# Patient Record
Sex: Male | Born: 1950 | Race: White | Hispanic: No | Marital: Married | Smoking: Former smoker
Health system: Southern US, Community
[De-identification: ages and names within clinical notes are randomized; demographics above are authoritative.]

## PROBLEM LIST (undated history)

## (undated) DIAGNOSIS — I10 Essential (primary) hypertension: Secondary | ICD-10-CM

## (undated) DIAGNOSIS — I639 Cerebral infarction, unspecified: Secondary | ICD-10-CM

## (undated) DIAGNOSIS — B20 Human immunodeficiency virus [HIV] disease: Secondary | ICD-10-CM

---

## 2016-12-29 ENCOUNTER — Other Ambulatory Visit: Payer: Self-pay

## 2016-12-29 ENCOUNTER — Encounter (HOSPITAL_BASED_OUTPATIENT_CLINIC_OR_DEPARTMENT_OTHER): Payer: Self-pay | Admitting: *Deleted

## 2016-12-29 ENCOUNTER — Emergency Department (HOSPITAL_BASED_OUTPATIENT_CLINIC_OR_DEPARTMENT_OTHER)
Admission: EM | Admit: 2016-12-29 | Discharge: 2016-12-29 | Disposition: A | Discharge status: Home / Self Care | Payer: Medicare Other | Attending: Emergency Medicine | Admitting: Emergency Medicine

## 2016-12-29 ENCOUNTER — Emergency Department (HOSPITAL_BASED_OUTPATIENT_CLINIC_OR_DEPARTMENT_OTHER): Payer: Medicare Other

## 2016-12-29 DIAGNOSIS — M5412 Radiculopathy, cervical region: Secondary | ICD-10-CM | POA: Diagnosis not present

## 2016-12-29 DIAGNOSIS — Z87891 Personal history of nicotine dependence: Secondary | ICD-10-CM | POA: Insufficient documentation

## 2016-12-29 DIAGNOSIS — Z7982 Long term (current) use of aspirin: Secondary | ICD-10-CM | POA: Diagnosis not present

## 2016-12-29 DIAGNOSIS — Z21 Asymptomatic human immunodeficiency virus [HIV] infection status: Secondary | ICD-10-CM | POA: Diagnosis not present

## 2016-12-29 DIAGNOSIS — I10 Essential (primary) hypertension: Secondary | ICD-10-CM | POA: Insufficient documentation

## 2016-12-29 DIAGNOSIS — M542 Cervicalgia: Secondary | ICD-10-CM | POA: Diagnosis present

## 2016-12-29 DIAGNOSIS — Z79899 Other long term (current) drug therapy: Secondary | ICD-10-CM | POA: Insufficient documentation

## 2016-12-29 HISTORY — DX: Essential (primary) hypertension: I10

## 2016-12-29 HISTORY — DX: Cerebral infarction, unspecified: I63.9

## 2016-12-29 HISTORY — DX: Human immunodeficiency virus (HIV) disease: B20

## 2016-12-29 MED ORDER — KETOROLAC TROMETHAMINE 10 MG PO TABS: 10.0000 mg | ORAL_TABLET | Freq: Four times a day (QID) | ORAL | 0 refills | Status: DC | PRN

## 2016-12-29 MED ORDER — DEXAMETHASONE SODIUM PHOSPHATE 10 MG/ML IJ SOLN
10.0000 mg | Freq: Once | INTRAMUSCULAR | Status: AC
  Administered 2016-12-29: 10 mg via INTRAMUSCULAR
  Filled 2016-12-29: qty 1

## 2016-12-29 MED ORDER — IBUPROFEN 600 MG PO TABS: 600.0000 mg | ORAL_TABLET | Freq: Four times a day (QID) | ORAL | 0 refills | Status: AC | PRN

## 2016-12-29 MED ORDER — PREDNISONE 10 MG (21) PO TBPK: ORAL_TABLET | ORAL | 0 refills | Status: AC

## 2016-12-29 MED ORDER — KETOROLAC TROMETHAMINE 30 MG/ML IJ SOLN
30.0000 mg | Freq: Once | INTRAMUSCULAR | Status: AC
  Administered 2016-12-29: 30 mg via INTRAMUSCULAR
  Filled 2016-12-29: qty 1

## 2016-12-29 MED FILL — predniSONE 10 MG TABS: 10 | 12 days supply | Qty: 42 | Fill #0

## 2016-12-29 MED FILL — IBUPROFEN 600 MG TABLET: 600 | 5 days supply | Qty: 20 | Fill #0

## 2016-12-29 NOTE — ED Notes (Signed)
Patient transported to X-ray 

## 2016-12-29 NOTE — ED Provider Notes (Signed)
[facility]   CSN: [id number] Arrival date & time: 12/29/16  1237     History   Chief Complaint Chief Complaint  Patient presents with  . Arm Pain    HPI Nathan Boyd is a 66 y.o. male.  Pt presents to the ED today with left arm pain.  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  The pt said that he usually sleeps on his right side, but thinks he flipped over on his left side.  He woke up with pain to his left shoulder and neck with pain down to left elbow.  Pt denies cp or sob.      Past Medical History:  Diagnosis Date  . HIV (human immunodeficiency virus infection) (HCC)   . Hypertension   . Stroke [facility])     There are no active problems to display for this patient.   History reviewed. No pertinent surgical history.     Home Medications    Prior to Admission medications   Medication Sig Start Date End Date Taking? Authorizing Provider  aspirin EC 81 MG tablet Take 81 mg by mouth daily.   Yes [provider]  atorvastatin (LIPITOR) 40 MG tablet Take 40 mg by mouth daily.   Yes [provider]  cetirizine (ZYRTEC) 5 MG tablet Take 5 mg by mouth daily.   Yes [provider]  clopidogrel (PLAVIX) 75 MG tablet Take 75 mg by mouth daily.   Yes [provider]  docusate sodium (COLACE) 100 MG capsule Take 100 mg by mouth 2 (two) times daily.   Yes [provider]  efavirenz-emtricitabine-tenofovir (ATRIPLA) 600-200-300 MG tablet Take 1 tablet by mouth at bedtime.   Yes [provider]  ezetimibe (ZETIA) 10 MG tablet Take 10 mg by mouth daily.   Yes [provider]  ferrous sulfate 325 (65 FE) MG EC tablet Take 325 mg by mouth 3 (three) times daily with meals.   Yes [provider]  folic acid (FOLVITE) 1 MG tablet Take 1 mg by mouth daily.   Yes [provider]  lisinopril (PRINIVIL,ZESTRIL) 10 MG tablet Take 10 mg by mouth daily.   Yes [provider]  ketorolac (TORADOL) 10 MG tablet Take 1 tablet (10 mg total) by mouth every 6 (six) hours as needed. 12/29/16   Jacalyn Lefevre, MD  predniSONE (STERAPRED UNI-PAK 21 TAB) 10 MG (21) TBPK tablet Take 6 tabs by mouth daily  for 2 days, then 5 tabs for 2 days, then 4 tabs for 2 days, then 3 tabs for 2 days, 2 tabs for 2 days, then 1 tab by mouth daily for 2 days 12/29/16   Jacalyn Lefevre, MD    Family History History reviewed. No pertinent family history.  Social History Social History  Substance Use Topics  . Smoking status: Former Games developer  . Smokeless tobacco: Never Used  . Alcohol use Yes     Allergies   Tetracyclines & related   Review of Systems Review of Systems  Musculoskeletal: Positive for neck pain.       Left shoulder pain  All other systems reviewed and are negative.    Physical Exam Updated Vital Signs BP 118/88 (BP Location: Right Arm)   Pulse 75   Temp 98.7 F (37.1 C) (Oral)   Resp 18   Ht 5\' 8"  (1.727 m)   Wt 245 lb (111.1 kg)   SpO2 96%   BMI 37.25  kg/m   Physical Exam  Constitutional: He appears well-developed and well-nourished.  HENT:  Head: Normocephalic and atraumatic.  Right Ear: External ear normal.  Left Ear: External ear normal.  Nose: Nose normal.  Mouth/Throat: Oropharynx is clear and moist.  Eyes: Conjunctivae and EOM are normal. Pupils are equal, round, and reactive to light.  Neck: Neck supple.    Cardiovascular: Normal rate, regular rhythm, normal heart sounds and intact distal pulses.   Pulmonary/Chest: Effort normal and breath sounds normal.  Abdominal: Soft. Bowel sounds are normal.  Musculoskeletal:  Chronic weakness left arm, no deformity  Neurological: He is alert.  Skin: Skin is warm.  Psychiatric: He has a normal mood and affect. His behavior is normal. Judgment and thought content normal.  Nursing note and vitals reviewed.    ED Treatments / Results  Labs (all labs ordered are listed, but only  abnormal results are displayed) Labs Reviewed - No data to display  EKG  EKG Interpretation  Date/Time:  Friday Dec 29 2016 12:55:16 EDT Ventricular Rate:  97 PR Interval:  168 QRS Duration: 88 QT Interval:  350 QTC Calculation: 444 R Axis:   24 Text Interpretation:  Normal sinus rhythm Normal ECG Confirmed by Astra Gregg MD, Maloree Uplinger ([id number]) on 12/29/2016 2:01:56 PM       Radiology Dg Cervical Spine Complete  Result Date: 12/29/2016 CLINICAL DATA:  Pt with left upper extremity pain, shoulder pain down to elbow, left arm deficit - unable to move left arm, no previous surgeries or injuries EXAM: CERVICAL SPINE - COMPLETE 4+ VIEW COMPARISON:  None. FINDINGS: No fracture or spondylolisthesis. Straightening of the normal cervical lordosis. There is bony fusion across the C6-C7 disc space. Mild to moderate loss of disc height is noted at C5-C6 with endplate spurring. There is also mild to moderate loss of disc height at C7-T1. Remaining cervical disc spaces are well preserved. There is facet degenerative change, relatively mild, most evident on the left at C4-C5. Neural foraminal assessment is somewhat limited by positioning. At least mild neural foraminal narrowing is noted at several levels due to uncovertebral spurring. Soft tissues are unremarkable. IMPRESSION: 1. No fracture, spondylolisthesis or acute finding. 2. Degenerative changes as described. Neural foraminal narrowing would be better assessed with either cervical MRI or CT. Electronically Signed   By: Amie Portland M.D.   On: 12/29/2016 14:50   Dg Shoulder Left  Result Date: 12/29/2016 CLINICAL DATA:  Left shoulder pain. History of stroke and left upper extremity deficit. EXAM: LEFT SHOULDER - 2+ VIEW COMPARISON:  None. FINDINGS: Osteopenia in the left shoulder. The left shoulder is located on these two views. Left AC joint demonstrates normal alignment. Visualized left ribs are intact. No acute fracture. IMPRESSION: No acute abnormality in  the left shoulder. Electronically Signed   By: Richarda Overlie M.D.   On: 12/29/2016 14:53    Procedures Procedures (including critical care time)  Medications Ordered in ED Medications  dexamethasone (DECADRON) injection 10 mg (10 mg Intramuscular Given 12/29/16 1408)  ketorolac (TORADOL) 30 MG/ML injection 30 mg (30 mg Intramuscular Given 12/29/16 1408)     Initial Impression / Assessment and Plan / ED Course  I have reviewed the triage vital signs and the nursing notes.  Pertinent labs & imaging results that were available during my care of the patient were reviewed by me and considered in my medical decision making (see chart for details).     Sx c/w cervical radiculopathy.  Pt is stable for d/c.  He knows to return if worse.  Final Clinical Impressions(s) / ED Diagnoses   Final diagnoses:  Cervical radiculopathy    New Prescriptions New Prescriptions   KETOROLAC (TORADOL) 10 MG TABLET    Take 1 tablet (10 mg total) by mouth every 6 (six) hours as needed.   PREDNISONE (STERAPRED UNI-PAK 21 TAB) 10 MG (21) TBPK TABLET    Take 6 tabs by mouth daily  for 2 days, then 5 tabs for 2 days, then 4 tabs for 2 days, then 3 tabs for 2 days, 2 tabs for 2 days, then 1 tab by mouth daily for 2 days     Jacalyn LefevreHaviland, Crockett Rallo, MD 12/29/16 1535

## 2016-12-29 NOTE — ED Notes (Signed)
ED Provider at bedside. 

## 2016-12-29 NOTE — ED Triage Notes (Signed)
Pt reports left arm pain from shoulder to elbow, and some involvement of left upper chest area and left only with movement and positioning. Denies sob, nausea, dizzyness or any other c/o. ekg performed while pt being triaged.

## 2018-10-09 IMAGING — DX DG CERVICAL SPINE COMPLETE 4+V
6 series · 6 of 6 positions shown · non-contrast
Comparison: None.

CLINICAL DATA: Pt with left upper extremity pain, shoulder pain
down to elbow, left arm deficit - unable to move left arm, no
previous surgeries or injuries

EXAM:
CERVICAL SPINE - COMPLETE 4+ VIEW

[c-spine lat]
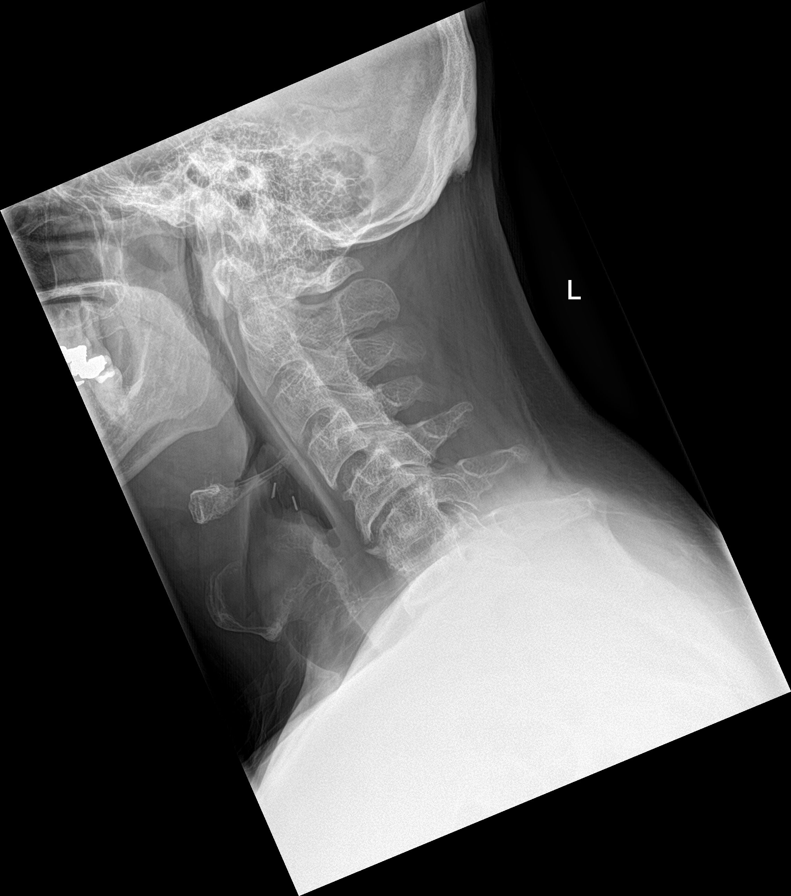

[c-spine obl (1 of 2)]
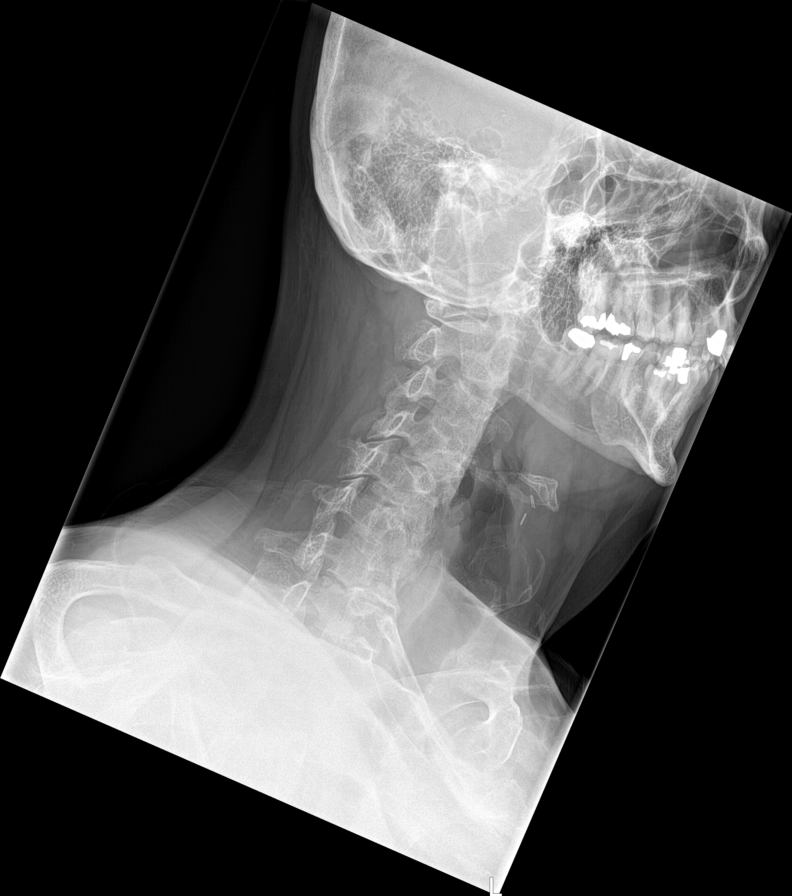

[c-spine obl (2 of 2)]
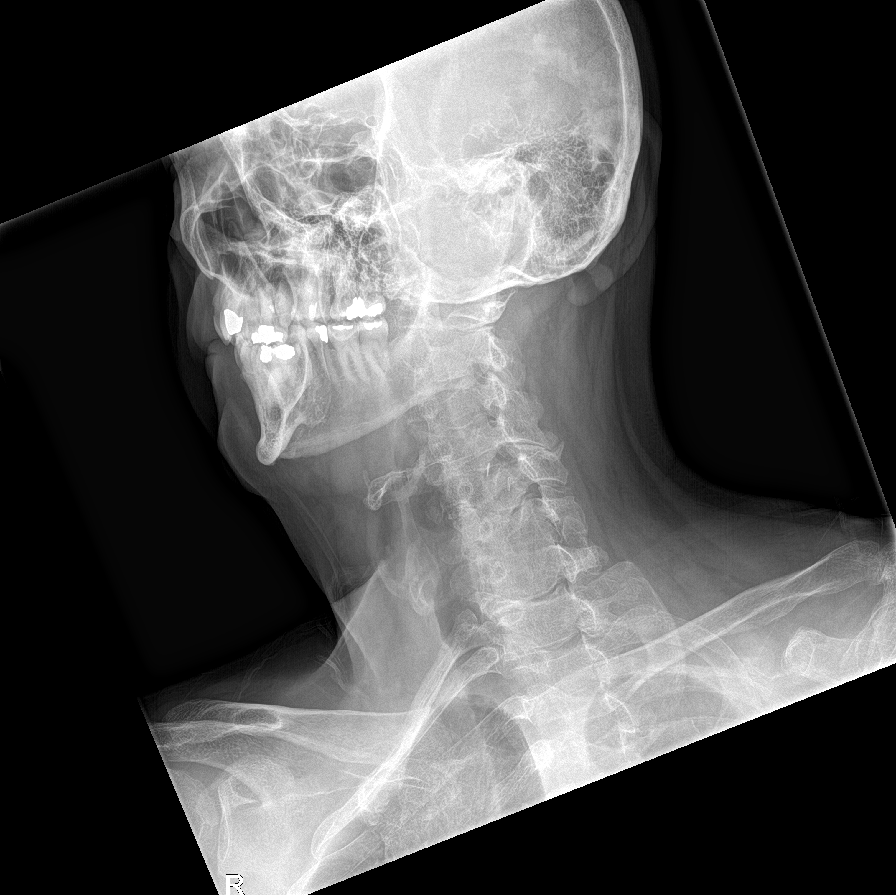

[c-spine ap]
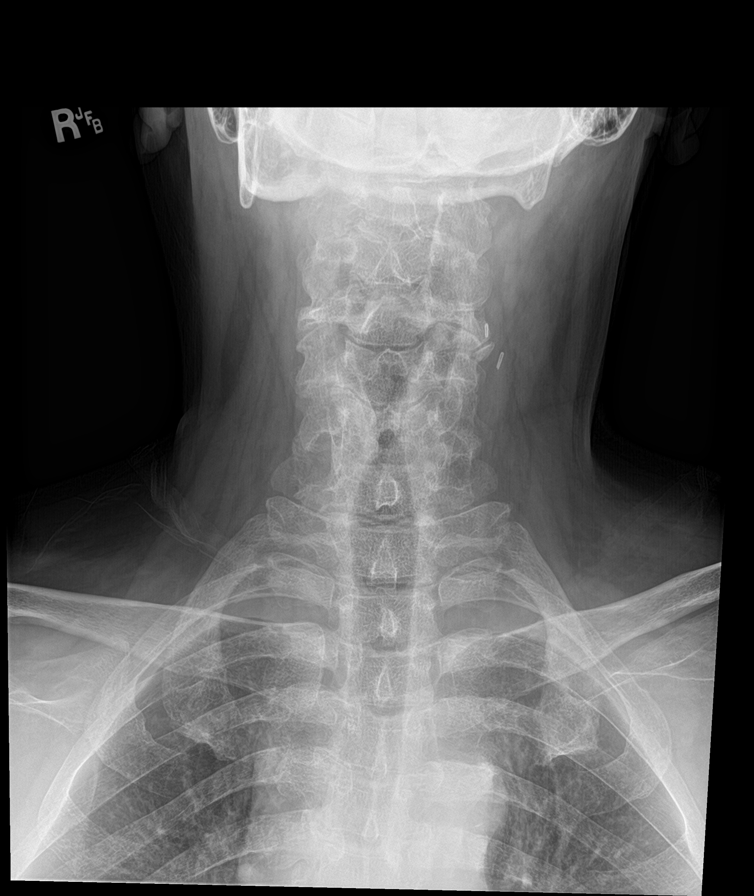

[c-spine open mouth]
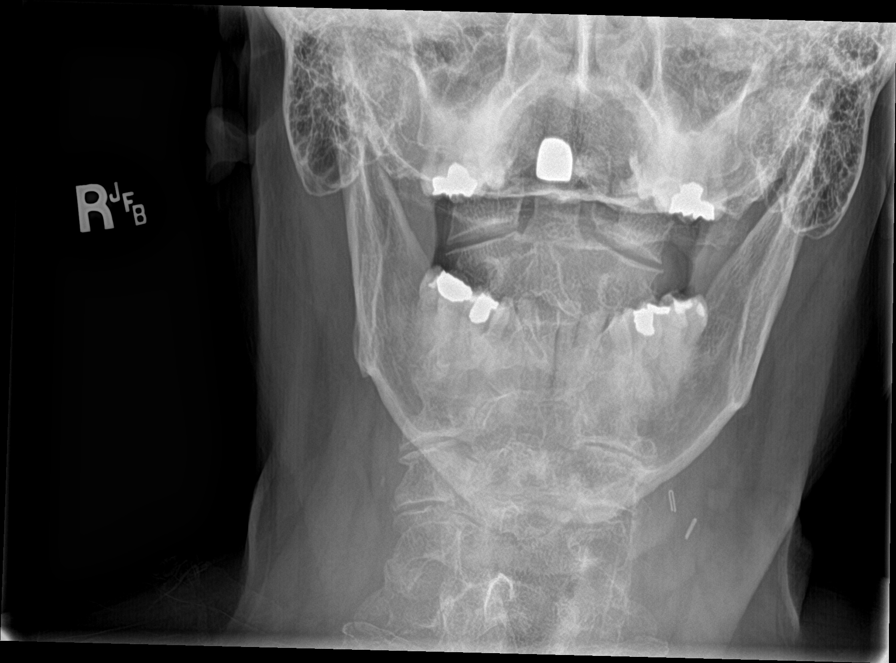

[c-spine swimmers]
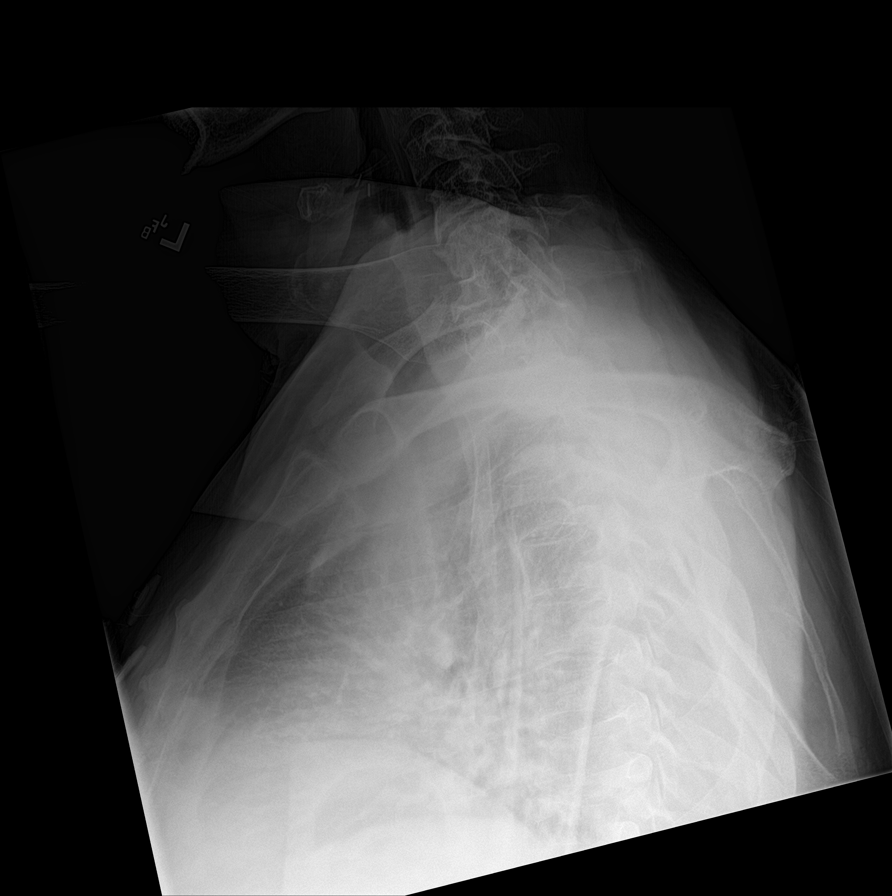

[6 of 6 positions shown; findings below may reference images not displayed]

FINDINGS: No fracture or spondylolisthesis. Straightening of the normal
cervical lordosis.

There is bony fusion across the C6-C7 disc space. Mild to moderate
loss of disc height is noted at C5-C6 with endplate spurring. There
is also mild to moderate loss of disc height at C7-T1. Remaining
cervical disc spaces are well preserved. There is facet degenerative
change, relatively mild, most evident on the left at C4-C5.

Neural foraminal assessment is somewhat limited by positioning. At
least mild neural foraminal narrowing is noted at several levels due
to uncovertebral spurring.

Soft tissues are unremarkable.
IMPRESSION: 1. No fracture, spondylolisthesis or acute finding.
2. Degenerative changes as described. Neural foraminal narrowing
would be better assessed with either cervical MRI or CT.
